# Patient Record
Sex: Male | Born: 1980 | Race: Black or African American | Hispanic: No | Marital: Single | Smoking: Current every day smoker
Health system: Southern US, Community
[De-identification: ages and names within clinical notes are randomized; demographics above are authoritative.]

---

## 2008-11-30 ENCOUNTER — Emergency Department (HOSPITAL_COMMUNITY): Admission: EM | Admit: 2008-11-30 | Discharge: 2008-11-30 | Payer: Self-pay | Admitting: Emergency Medicine

## 2014-11-26 ENCOUNTER — Encounter (HOSPITAL_COMMUNITY): Payer: Self-pay | Admitting: General Practice

## 2014-11-26 ENCOUNTER — Emergency Department (HOSPITAL_COMMUNITY)
Admission: EM | Admit: 2014-11-26 | Discharge: 2014-11-26 | Disposition: A | Discharge status: Home / Self Care | Payer: Self-pay | Attending: Emergency Medicine | Admitting: Emergency Medicine

## 2014-11-26 ENCOUNTER — Emergency Department (HOSPITAL_COMMUNITY): Payer: Self-pay

## 2014-11-26 DIAGNOSIS — Y998 Other external cause status: Secondary | ICD-10-CM | POA: Insufficient documentation

## 2014-11-26 DIAGNOSIS — Y9389 Activity, other specified: Secondary | ICD-10-CM | POA: Insufficient documentation

## 2014-11-26 DIAGNOSIS — S01111A Laceration without foreign body of right eyelid and periocular area, initial encounter: Secondary | ICD-10-CM | POA: Insufficient documentation

## 2014-11-26 DIAGNOSIS — S01511A Laceration without foreign body of lip, initial encounter: Secondary | ICD-10-CM | POA: Insufficient documentation

## 2014-11-26 DIAGNOSIS — Y9289 Other specified places as the place of occurrence of the external cause: Secondary | ICD-10-CM | POA: Insufficient documentation

## 2014-11-26 DIAGNOSIS — Y29XXXA Contact with blunt object, undetermined intent, initial encounter: Secondary | ICD-10-CM | POA: Insufficient documentation

## 2014-11-26 DIAGNOSIS — S01411A Laceration without foreign body of right cheek and temporomandibular area, initial encounter: Secondary | ICD-10-CM | POA: Insufficient documentation

## 2014-11-26 MED ORDER — LIDOCAINE-EPINEPHRINE (PF) 2 %-1:200000 IJ SOLN
10.0000 mL | Freq: Once | INTRAMUSCULAR | Status: AC
  Administered 2014-11-26: 10 mL
  Filled 2014-11-26: qty 20

## 2014-11-26 MED ORDER — MORPHINE SULFATE 4 MG/ML IJ SOLN
4.0000 mg | Freq: Once | INTRAMUSCULAR | Status: AC
  Administered 2014-11-26: 4 mg via INTRAVENOUS
  Filled 2014-11-26: qty 1

## 2014-11-26 MED ORDER — PENICILLIN V POTASSIUM 250 MG PO TABS: 250.0000 mg | ORAL_TABLET | Freq: Four times a day (QID) | ORAL | Status: AC

## 2014-11-26 MED ORDER — HYDROCODONE-ACETAMINOPHEN 5-325 MG PO TABS: 1.0000 | ORAL_TABLET | ORAL | Status: AC | PRN
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
## 2014-11-26 NOTE — ED Notes (Addendum)
Pt back from radiology 

## 2014-11-26 NOTE — Discharge Instructions (Signed)
Facial Laceration ° A facial laceration is a cut on the face. These injuries can be painful and cause bleeding. Lacerations usually heal quickly, but they need special care to reduce scarring. °DIAGNOSIS  °Your health care provider will take a medical history, ask for details about how the injury occurred, and examine the wound to determine how deep the cut is. °TREATMENT  °Some facial lacerations may not require closure. Others may not be able to be closed because of an increased risk of infection. The risk of infection and the chance for successful closure will depend on various factors, including the amount of time since the injury occurred. °The wound may be cleaned to help prevent infection. If closure is appropriate, pain medicines may be given if needed. Your health care provider will use stitches (sutures), wound glue (adhesive), or skin adhesive strips to repair the laceration. These tools bring the skin edges together to allow for faster healing and a better cosmetic outcome. If needed, you may also be given a tetanus shot. °HOME CARE INSTRUCTIONS °· Only take over-the-counter or prescription medicines as directed by your health care provider. °· Follow your health care provider's instructions for wound care. These instructions will vary depending on the technique used for closing the wound. °For Sutures: °· Keep the wound clean and dry.   °· If you were given a bandage (dressing), you should change it at least once a day. Also change the dressing if it becomes wet or dirty, or as directed by your health care provider.   °· Wash the wound with soap and water 2 times a day. Rinse the wound off with water to remove all soap. Pat the wound dry with a clean towel.   °· After cleaning, apply a thin layer of the antibiotic ointment recommended by your health care provider. This will help prevent infection and keep the dressing from sticking.   °· You may shower as usual after the first 24 hours. Do not soak the  wound in water until the sutures are removed.   °· Get your sutures removed as directed by your health care provider. With facial lacerations, sutures should usually be taken out after 4-5 days to avoid stitch marks.   °· Wait a few days after your sutures are removed before applying any makeup. °For Skin Adhesive Strips: °· Keep the wound clean and dry.   °· Do not get the skin adhesive strips wet. You may bathe carefully, using caution to keep the wound dry.   °· If the wound gets wet, pat it dry with a clean towel.   °· Skin adhesive strips will fall off on their own. You may trim the strips as the wound heals. Do not remove skin adhesive strips that are still stuck to the wound. They will fall off in time.   °For Wound Adhesive: °· You may briefly wet your wound in the shower or bath. Do not soak or scrub the wound. Do not swim. Avoid periods of heavy sweating until the skin adhesive has fallen off on its own. After showering or bathing, gently pat the wound dry with a clean towel.   °· Do not apply liquid medicine, cream medicine, ointment medicine, or makeup to your wound while the skin adhesive is in place. This may loosen the film before your wound is healed.   °· If a dressing is placed over the wound, be careful not to apply tape directly over the skin adhesive. This may cause the adhesive to be pulled off before the wound is healed.   °· Avoid   prolonged exposure to sunlight or tanning lamps while the skin adhesive is in place.  The skin adhesive will usually remain in place for 5-10 days, then naturally fall off the skin. Do not pick at the adhesive film.  After Healing: Once the wound has healed, cover the wound with sunscreen during the day for 1 full year. This can help minimize scarring. Exposure to ultraviolet light in the first year will darken the scar. It can take 1-2 years for the scar to lose its redness and to heal completely.  SEEK IMMEDIATE MEDICAL CARE IF:  You have redness, pain, or  swelling around the wound.   You see ayellowish-white fluid (pus) coming from the wound.   You have chills or a fever.  MAKE SURE YOU:  Understand these instructions.  Will watch your condition.  Will get help right away if you are not doing well or get worse. Document Released: 07/22/2004 Document Revised: 04/04/2013 Document Reviewed: 01/25/2013 [facility] Patient Information 2015 [place], [place]. This information is not intended to replace advice given to you by your health care provider. Make sure you discuss any questions you have with your health care provider.  Assault, General Assault includes any behavior, whether intentional or reckless, which results in bodily injury to another person and/or damage to property. Included in this would be any behavior, intentional or reckless, that by its nature would be understood (interpreted) by a reasonable person as intent to harm another person or to damage his/her property. Threats may be oral or written. They may be communicated through regular mail, computer, fax, or phone. These threats may be direct or implied. FORMS OF ASSAULT INCLUDE:  Physically assaulting a person. This includes physical threats to inflict physical harm as well as:  Slapping.  Hitting.  Poking.  Kicking.  Punching.  Pushing.  Arson.  Sabotage.  Equipment vandalism.  Damaging or destroying property.  Throwing or hitting objects.  Displaying a weapon or an object that appears to be a weapon in a threatening manner.  Carrying a firearm of any kind.  Using a weapon to harm someone.  Using greater physical size/strength to intimidate another.  Making intimidating or threatening gestures.  Bullying.  Hazing.  Intimidating, threatening, hostile, or abusive language directed toward another person.  It communicates the intention to engage in violence against that person. And it leads a reasonable person to expect that violent behavior may  occur.  Stalking another person. IF IT HAPPENS AGAIN:  Immediately call for emergency help (911 in [place]  If someone poses clear and immediate danger to you, seek legal authorities to have a protective or restraining order put in place.  Less threatening assaults can at least be reported to authorities. STEPS TO TAKE IF A SEXUAL ASSAULT HAS HAPPENED  Go to an area of safety. This may include a shelter or staying with a friend. Stay away from the area where you have been attacked. A large percentage of sexual assaults are caused by a friend, relative or associate.  If medications were given by your caregiver, take them as directed for the full length of time prescribed.  Only take over-the-counter or prescription medicines for pain, discomfort, or fever as directed by your caregiver.  If you have come in contact with a sexual disease, find out if you are to be tested again. If your caregiver is concerned about the HIV/AIDS virus, he/she may require you to have continued testing for several months.  For the protection of your privacy, test  results can not be given over the phone. Make sure you receive the results of your test. If your test results are not back during your visit, make an appointment with your caregiver to find out the results. Do not assume everything is normal if you have not heard from your caregiver or the medical facility. It is important for you to follow up on all of your test results.  File appropriate papers with authorities. This is important in all assaults, even if it has occurred in a family or by a friend. SEEK MEDICAL CARE IF:  You have new problems because of your injuries.  You have problems that may be because of the medicine you are taking, such as:  Rash.  Itching.  Swelling.  Trouble breathing.  You develop belly (abdominal) pain, feel sick to your stomach (nausea) or are vomiting.  You begin to run a temperature.  You need supportive care  or referral to a rape crisis center. These are centers with trained personnel who can help you get through this ordeal. SEEK IMMEDIATE MEDICAL CARE IF:  You are afraid of being threatened, beaten, or abused. In [place] call 911.  You receive new injuries related to abuse.  You develop severe pain in any area injured in the assault or have any change in your condition that concerns you.  You faint or lose consciousness.  You develop chest pain or shortness of breath. Document Released: 06/14/2005 Document Revised: 09/06/2011 Document Reviewed: 01/31/2008 [facility] Patient Information 2015 [place]. This information is not intended to replace advice given to you by your health care provider. Make sure you discuss any questions you have with your health care provider.

## 2014-11-26 NOTE — ED Notes (Signed)
Patient transported to CT 
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
## 2018-02-25 ENCOUNTER — Emergency Department (HOSPITAL_COMMUNITY)
Admission: EM | Admit: 2018-02-25 | Discharge: 2018-02-25 | Disposition: A | Discharge status: Home / Self Care | Payer: Self-pay | Attending: Emergency Medicine | Admitting: Emergency Medicine

## 2018-02-25 ENCOUNTER — Other Ambulatory Visit: Payer: Self-pay

## 2018-02-25 ENCOUNTER — Encounter (HOSPITAL_COMMUNITY): Payer: Self-pay

## 2018-02-25 ENCOUNTER — Emergency Department (HOSPITAL_COMMUNITY): Payer: Self-pay

## 2018-02-25 DIAGNOSIS — Y9389 Activity, other specified: Secondary | ICD-10-CM | POA: Insufficient documentation

## 2018-02-25 DIAGNOSIS — S0990XA Unspecified injury of head, initial encounter: Secondary | ICD-10-CM

## 2018-02-25 DIAGNOSIS — W25XXXA Contact with sharp glass, initial encounter: Secondary | ICD-10-CM | POA: Insufficient documentation

## 2018-02-25 DIAGNOSIS — Y999 Unspecified external cause status: Secondary | ICD-10-CM | POA: Insufficient documentation

## 2018-02-25 DIAGNOSIS — R51 Headache: Secondary | ICD-10-CM | POA: Insufficient documentation

## 2018-02-25 DIAGNOSIS — Y929 Unspecified place or not applicable: Secondary | ICD-10-CM | POA: Insufficient documentation

## 2018-02-25 DIAGNOSIS — F17210 Nicotine dependence, cigarettes, uncomplicated: Secondary | ICD-10-CM | POA: Insufficient documentation

## 2018-02-25 DIAGNOSIS — S61411A Laceration without foreign body of right hand, initial encounter: Secondary | ICD-10-CM | POA: Insufficient documentation

## 2018-02-25 MED ORDER — LIDOCAINE-EPINEPHRINE (PF) 2 %-1:200000 IJ SOLN
10.0000 mL | Freq: Once | INTRAMUSCULAR | Status: AC
  Administered 2018-02-25: 10 mL
  Filled 2018-02-25: qty 20

## 2018-02-25 MED ORDER — ACETAMINOPHEN 500 MG PO TABS
1000.0000 mg | ORAL_TABLET | Freq: Once | ORAL | Status: AC
Start: 2018-02-25 — End: 2018-02-25
  Administered 2018-02-25: 1000 mg via ORAL
  Filled 2018-02-25: qty 2
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
